# Patient Record
Sex: Male | Born: 1985 | Race: Black or African American | Hispanic: No | Smoking: Never smoker
Health system: Southern US, Community
[De-identification: ages and names within clinical notes are randomized; demographics above are authoritative.]

## PROBLEM LIST (undated history)

## (undated) DIAGNOSIS — F419 Anxiety disorder, unspecified: Secondary | ICD-10-CM

## (undated) DIAGNOSIS — I517 Cardiomegaly: Secondary | ICD-10-CM

---

## 2020-02-29 ENCOUNTER — Other Ambulatory Visit: Payer: Self-pay

## 2020-02-29 ENCOUNTER — Encounter (HOSPITAL_COMMUNITY): Payer: Self-pay | Admitting: Emergency Medicine

## 2020-02-29 ENCOUNTER — Ambulatory Visit (HOSPITAL_COMMUNITY)
Admission: EM | Admit: 2020-02-29 | Discharge: 2020-02-29 | Disposition: A | Discharge status: Home / Self Care | Payer: BC Managed Care – PPO | Attending: Family Medicine | Admitting: Family Medicine

## 2020-02-29 DIAGNOSIS — Z20822 Contact with and (suspected) exposure to covid-19: Secondary | ICD-10-CM | POA: Diagnosis not present

## 2020-02-29 DIAGNOSIS — F419 Anxiety disorder, unspecified: Secondary | ICD-10-CM | POA: Diagnosis not present

## 2020-02-29 DIAGNOSIS — B349 Viral infection, unspecified: Secondary | ICD-10-CM

## 2020-02-29 DIAGNOSIS — R0981 Nasal congestion: Secondary | ICD-10-CM

## 2020-02-29 DIAGNOSIS — R519 Headache, unspecified: Secondary | ICD-10-CM | POA: Diagnosis not present

## 2020-02-29 DIAGNOSIS — R6883 Chills (without fever): Secondary | ICD-10-CM | POA: Insufficient documentation

## 2020-02-29 DIAGNOSIS — R52 Pain, unspecified: Secondary | ICD-10-CM

## 2020-02-29 MED ORDER — BENZONATATE 100 MG PO CAPS: 100.0000 mg | ORAL_CAPSULE | Freq: Three times a day (TID) | ORAL | 0 refills | Status: AC | PRN

## 2020-02-29 MED ORDER — PROMETHAZINE-DM 6.25-15 MG/5ML PO SYRP: 5.0000 mL | ORAL_SOLUTION | Freq: Every evening | ORAL | 0 refills | Status: AC | PRN

## 2020-02-29 MED ORDER — HYDROXYZINE HCL 25 MG PO TABS: 12.5000 mg | ORAL_TABLET | Freq: Three times a day (TID) | ORAL | 0 refills | Status: AC | PRN

## 2020-02-29 MED ORDER — CETIRIZINE HCL 10 MG PO TABS: 10.0000 mg | ORAL_TABLET | Freq: Every day | ORAL | 0 refills | Status: AC

## 2020-02-29 NOTE — ED Triage Notes (Signed)
Pt sts URI sx with congestion and body aches x 2 days

## 2020-02-29 NOTE — Discharge Instructions (Addendum)
We will manage this as a viral syndrome. For sore throat or cough try using a honey-based tea. Use 3 teaspoons of honey with juice squeezed from half lemon. Place shaved pieces of ginger into 1/2-1 cup of water and warm over stove top. Then mix the ingredients and repeat every 4 hours as needed. Please take Tylenol 500mg  every 6 hours for fevers, aches, body aches. Hydrate very well with at least 2 liters of water. Eat light meals such as soups to replenish electrolytes and soft fruits, veggies. Start an antihistamine like Zyrtec (cetirizine) at 10mg  daily for postnasal drainage, sinus congestion.

## 2020-02-29 NOTE — ED Provider Notes (Signed)
[facility]   MRN: [id number] DOB: [birth year]  Subjective:   Seth Pearson is a 34 y.o. male presenting for 2 day hx of acute onset worsening sinus congestion, sinus headaches, chills, body aches, upper belly pain.  Patient has tried over-the-counter medications for sinuses, cold/flu.  He is also had a history of anxiety in the past 2 to 3 years.  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  Unfortunately now since his anxiety has worsened has not been able to drive out of state in even has a difficult time driving as far as an hour away.  He does not get regular care.  Would like to get help establishing with a new PCP.  Denies smoking cigarettes and is not currently drinking.  Denies history of allergic rhinitis hearing [place] but did use to have it when he was in [place].  No current facility-administered medications for this encounter. No current outpatient medications on file.   No Known Allergies  History reviewed. No pertinent past medical history.   History reviewed. No pertinent surgical history.  History reviewed. No pertinent family history.  Social History   Tobacco Use   Smoking status: Never Smoker   Smokeless tobacco: Never Used  Substance Use Topics   Alcohol use: Not Currently   Drug use: Never    ROS   Objective:   Vitals: BP 129/72 (BP Location: Right Arm)    Pulse (!) 104    Temp 99.2 F (37.3 C) (Oral)    Resp 18    SpO2 95%   Physical Exam Constitutional:      General: He is not in acute distress.    Appearance: Normal appearance. He is well-developed and normal weight. He is not ill-appearing, toxic-appearing or diaphoretic.  HENT:     Head: Normocephalic and atraumatic.     Right Ear: Tympanic membrane, ear canal and external ear normal. There is no impacted cerumen.     Left Ear: Tympanic membrane, ear canal and external ear normal. There is no impacted cerumen.     Nose: Nose  normal. No congestion or rhinorrhea.     Mouth/Throat:     Mouth: Mucous membranes are moist.     Pharynx: Oropharynx is clear. No oropharyngeal exudate or posterior oropharyngeal erythema.  Eyes:     General: No scleral icterus.       Right eye: No discharge.        Left eye: No discharge.     Extraocular Movements: Extraocular movements intact.     Conjunctiva/sclera: Conjunctivae normal.     Pupils: Pupils are equal, round, and reactive to light.  Cardiovascular:     Rate and Rhythm: Normal rate and regular rhythm.     Heart sounds: Normal heart sounds. No murmur. No friction rub. No gallop.   Pulmonary:     Effort: Pulmonary effort is normal. No respiratory distress.     Breath sounds: Normal breath sounds. No stridor. No wheezing, rhonchi or rales.  Musculoskeletal:     Cervical back: Normal range of motion and neck supple. No rigidity. No muscular tenderness.  Neurological:     General: No focal deficit present.     Mental Status: He is alert and oriented to person, place, and time.  Psychiatric:        Mood and Affect: Mood is anxious.        Behavior: Behavior normal.  Thought Content: Thought content normal.      Assessment and Plan :   1. Viral syndrome   2. Sinus congestion   3. Body aches   4. Generalized headaches   5. Anxiety     Will manage for viral illness such as viral URI, viral rhinitis, possible COVID-19. Counseled patient on nature of COVID-19 including modes of transmission, diagnostic testing, management and supportive care.  Offered symptomatic relief. COVID 19 testing is pending.  Recommended patient use hydroxyzine for anxiety.  Counseled on need to find a new PCP, provided him with information to 3 different resources.  Counseled patient on potential for adverse effects with medications prescribed/recommended today, ER and return-to-clinic precautions discussed, patient verbalized understanding.     Jaynee Eagles, [place] 02/29/20 1858

## 2020-03-04 LAB — NOVEL CORONAVIRUS, NAA (HOSP ORDER, SEND-OUT TO REF LAB; TAT 18-24 HRS): SARS-CoV-2, NAA: NOT DETECTED

## 2021-01-08 ENCOUNTER — Ambulatory Visit (HOSPITAL_COMMUNITY)
Admission: EM | Admit: 2021-01-08 | Discharge: 2021-01-08 | Disposition: A | Discharge status: Home / Self Care | Payer: Self-pay | Attending: Emergency Medicine | Admitting: Emergency Medicine

## 2021-01-08 ENCOUNTER — Ambulatory Visit (INDEPENDENT_AMBULATORY_CARE_PROVIDER_SITE_OTHER): Payer: Self-pay

## 2021-01-08 ENCOUNTER — Encounter (HOSPITAL_COMMUNITY): Payer: Self-pay

## 2021-01-08 ENCOUNTER — Other Ambulatory Visit: Payer: Self-pay

## 2021-01-08 DIAGNOSIS — R059 Cough, unspecified: Secondary | ICD-10-CM

## 2021-01-08 DIAGNOSIS — U099 Post covid-19 condition, unspecified: Secondary | ICD-10-CM

## 2021-01-08 DIAGNOSIS — Z8616 Personal history of COVID-19: Secondary | ICD-10-CM

## 2021-01-08 MED ORDER — ALBUTEROL SULFATE HFA 108 (90 BASE) MCG/ACT IN AERS: 1.0000 | INHALATION_SPRAY | Freq: Four times a day (QID) | RESPIRATORY_TRACT | 0 refills | Status: AC | PRN

## 2021-01-08 MED ORDER — PREDNISONE 20 MG PO TABS: 40.0000 mg | ORAL_TABLET | Freq: Every day | ORAL | 0 refills | Status: AC

## 2021-01-08 NOTE — ED Triage Notes (Signed)
Pt presents with some sinus congestion that makes him feel like he is wheezing when he lays down or exerts energy for past few weeks.

## 2021-01-08 NOTE — ED Provider Notes (Signed)
[facility]    CSN: [id number] Arrival date & time: 01/08/21  1829      History   Chief Complaint Chief Complaint  Patient presents with  . Sinus Congestion    HPI Seth Pearson is a 35 y.o. male.   Mart Colpitts presents with complaints of worsening of cough. He was diagnosed with covid-19 December 17. He generally feels improved, however his cough has persisted and he feels like over the past few days is worsening. He notes wheezing and chest congestion at times. Sometimes shortness of breath . No current chest pain  Or shortness of breath . No further fevers. He is otherwise healthy, no past medical history. Occasionally cough is productive.   ROS per HPI, negative if not otherwise mentioned.      History reviewed. No pertinent past medical history.  There are no problems to display for this patient.   History reviewed. No pertinent surgical history.     Home Medications    Prior to Admission medications   Medication Sig Start Date End Date Taking? Authorizing Provider  albuterol (PROAIR HFA) 108 (90 Base) MCG/ACT inhaler Inhale 1-2 puffs into the lungs every 6 (six) hours as needed for wheezing or shortness of breath. 01/08/21  Yes Dava Rensch, Dorene Grebe B, NP  predniSONE (DELTASONE) 20 MG tablet Take 2 tablets (40 mg total) by mouth daily with breakfast for 5 days. 01/08/21 01/13/21 Yes Millicent Blazejewski, Barron Alvine, NP  benzonatate (TESSALON) 100 MG capsule Take 1-2 capsules (100-200 mg total) by mouth 3 (three) times daily as needed. 02/29/20   Wallis Bamberg, PA-C  cetirizine (ZYRTEC ALLERGY) 10 MG tablet Take 1 tablet (10 mg total) by mouth daily. 02/29/20   Wallis Bamberg, PA-C  hydrOXYzine (ATARAX/VISTARIL) 25 MG tablet Take 0.5-1 tablets (12.5-25 mg total) by mouth every 8 (eight) hours as needed for anxiety. 02/29/20   Wallis Bamberg, PA-C  promethazine-dextromethorphan (PROMETHAZINE-DM) 6.25-15 MG/5ML syrup Take 5 mLs by mouth at bedtime as needed for cough. 02/29/20   Wallis Bamberg, PA-C    Family History Family History  Family history unknown: Yes    Social History Social History   Tobacco Use  . Smoking status: Never Smoker  . Smokeless tobacco: Never Used  Substance Use Topics  . Alcohol use: Not Currently  . Drug use: Never     Allergies   Watermelon [citrullus vulgaris]   Review of Systems Review of Systems   Physical Exam Triage Vital Signs ED Triage Vitals  Enc Vitals Group     BP 01/08/21 1903 127/68     Pulse Rate 01/08/21 1903 91     Resp 01/08/21 1903 17     Temp 01/08/21 1903 97.9 F (36.6 C)     Temp Source 01/08/21 1903 Oral     SpO2 01/08/21 1903 100 %     Weight --      Height --      Head Circumference --      Peak Flow --      Pain Score 01/08/21 1901 0     Pain Loc --      Pain Edu? --      Excl. in GC? --    No data found.  Updated Vital Signs BP 127/68 (BP Location: Right Arm)   Pulse 91   Temp 97.9 F (36.6 C) (Oral)   Resp 17   SpO2 100%    Physical Exam Constitutional:      Appearance: He is well-developed.  HENT:     Head: Normocephalic and atraumatic.  Cardiovascular:     Rate and Rhythm: Normal rate.  Pulmonary:     Effort: Pulmonary effort is normal. No respiratory distress.     Breath sounds: Normal breath sounds.  Skin:    General: Skin is warm and dry.  Neurological:     Mental Status: He is alert and oriented to person, place, and time.      UC Treatments / Results  Labs (all labs ordered are listed, but only abnormal results are displayed) Labs Reviewed - No data to display  EKG   Radiology DG Chest 2 View  Result Date: 01/08/2021 CLINICAL DATA:  Worsening cough, history of COVID prior month EXAM: CHEST - 2 VIEW COMPARISON:  None. FINDINGS: Slight interstitial prominence. No pleural effusion. The heart size and mediastinal contours are within normal limits. The visualized skeletal structures are unremarkable. IMPRESSION: Slight interstitial prominence. There may be a  component of scarring from reported COVID. Electronically Signed   By: Guadlupe Spanish M.D.   On: 01/08/2021 19:53    Procedures Procedures (including critical care time)  Medications Ordered in UC Medications - No data to display  Initial Impression / Assessment and Plan / UC Course  I have reviewed the triage vital signs and the nursing notes.  Pertinent labs & imaging results that were available during my care of the patient were reviewed by me and considered in my medical decision making (see chart for details).     Recent covid. Persistent cough. Chest xray with questionable scarring, otherwise no acute findings. Inhaler and prednisone provided today to see if this is helpful with symptoms. Otherwise follow up recommendations and post-covid care clinic information provided. Return precautions provided. Patient verbalized understanding and agreeable to plan.   Final Clinical Impressions(s) / UC Diagnoses   Final diagnoses:  Cough  Post-COVID-19 condition     Discharge Instructions     Your chest xray looks overall well today, radiology questions some potential scarring?  I think we can try to see if an inhaler is helpful when you have episodes of wheezing.  I think we can try a course of prednisone to see if this is helpful at all.  Otherwise you may need to follow up with pulmonology, you can start with our post-covid care clinic for further recommendations if symptoms persist.     ED Prescriptions    Medication Sig Dispense Auth. Provider   albuterol (PROAIR HFA) 108 (90 Base) MCG/ACT inhaler Inhale 1-2 puffs into the lungs every 6 (six) hours as needed for wheezing or shortness of breath. 1 each Georgetta Haber, NP   predniSONE (DELTASONE) 20 MG tablet Take 2 tablets (40 mg total) by mouth daily with breakfast for 5 days. 10 tablet Georgetta Haber, NP     PDMP not reviewed this encounter.   Georgetta Haber, NP 01/10/21 1117

## 2021-01-08 NOTE — Discharge Instructions (Addendum)
Your chest xray looks overall well today, radiology questions some potential scarring?  I think we can try to see if an inhaler is helpful when you have episodes of wheezing.  I think we can try a course of prednisone to see if this is helpful at all.  Otherwise you may need to follow up with pulmonology, you can start with our post-covid care clinic for further recommendations if symptoms persist.

## 2021-04-14 DIAGNOSIS — J019 Acute sinusitis, unspecified: Secondary | ICD-10-CM | POA: Diagnosis not present

## 2021-04-14 DIAGNOSIS — Z20822 Contact with and (suspected) exposure to covid-19: Secondary | ICD-10-CM | POA: Diagnosis not present

## 2022-01-08 IMAGING — DX DG CHEST 2V
2 series · 2 of 2 positions shown · non-contrast
Comparison: None.

CLINICAL DATA: Worsening cough, history of COVID prior month

EXAM:
CHEST - 2 VIEW

[chest pa]
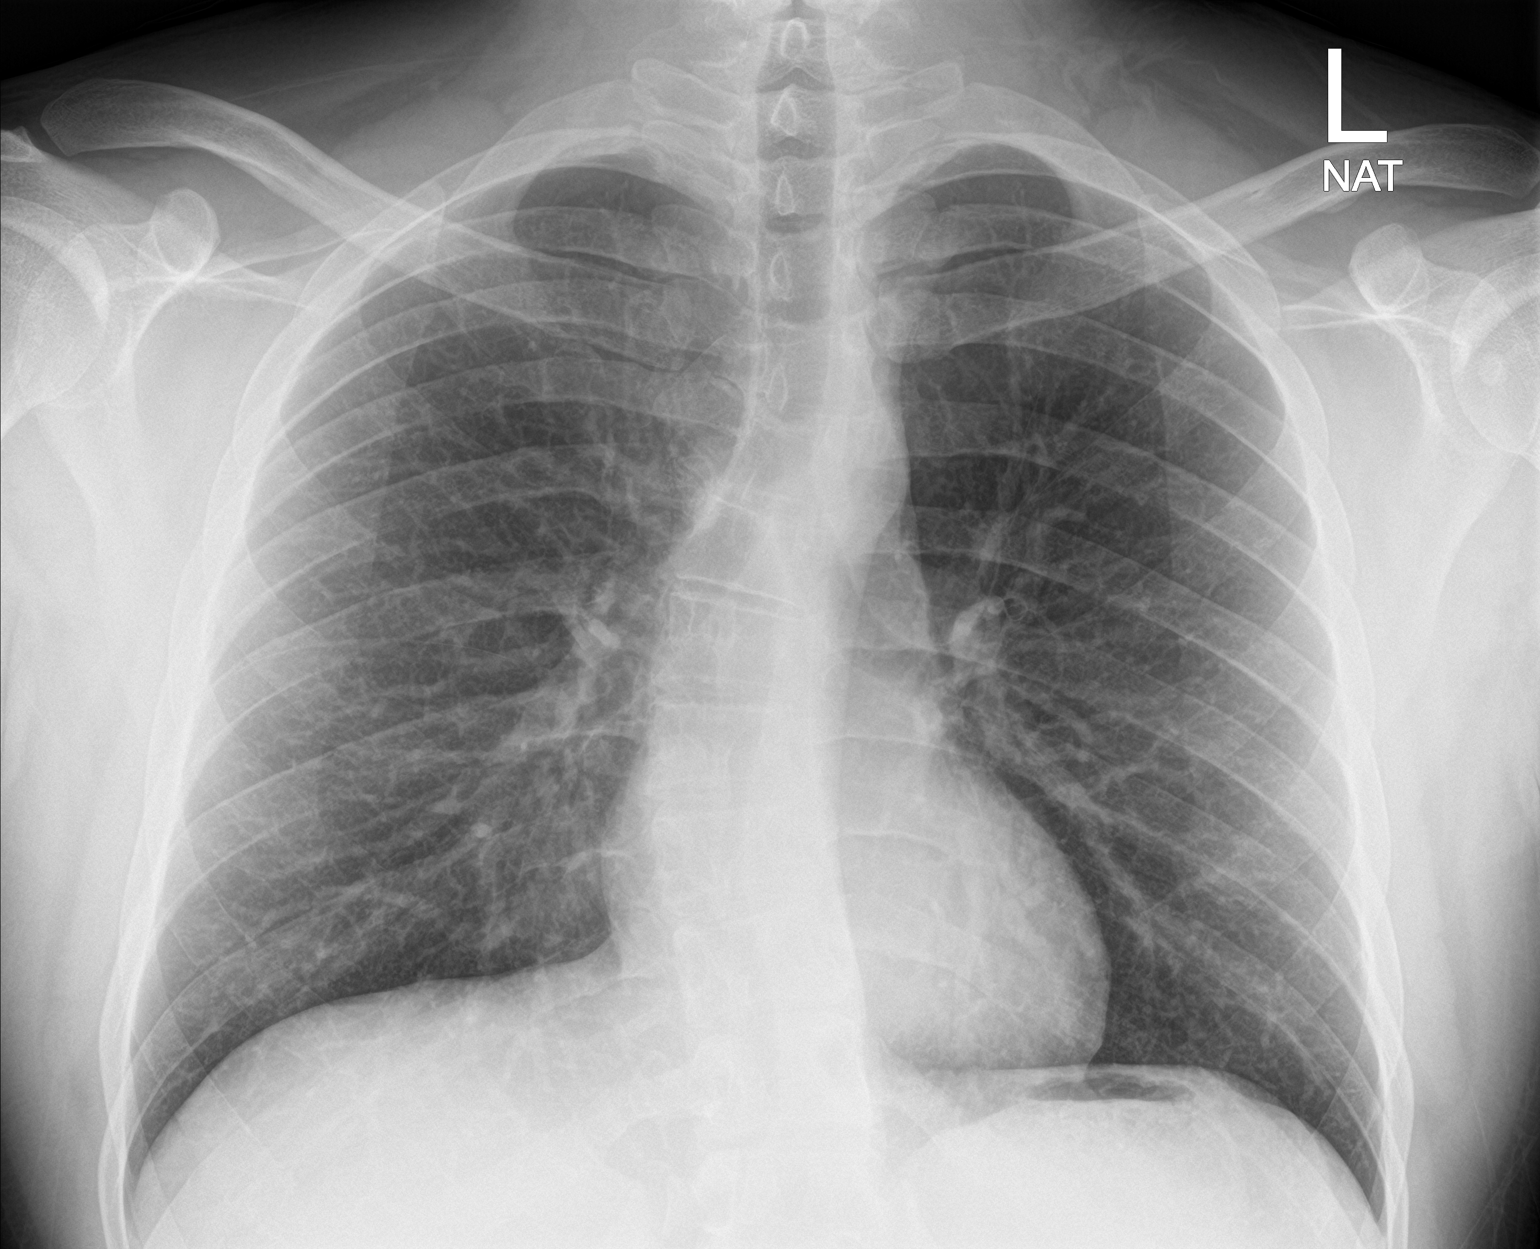

[chest lat]
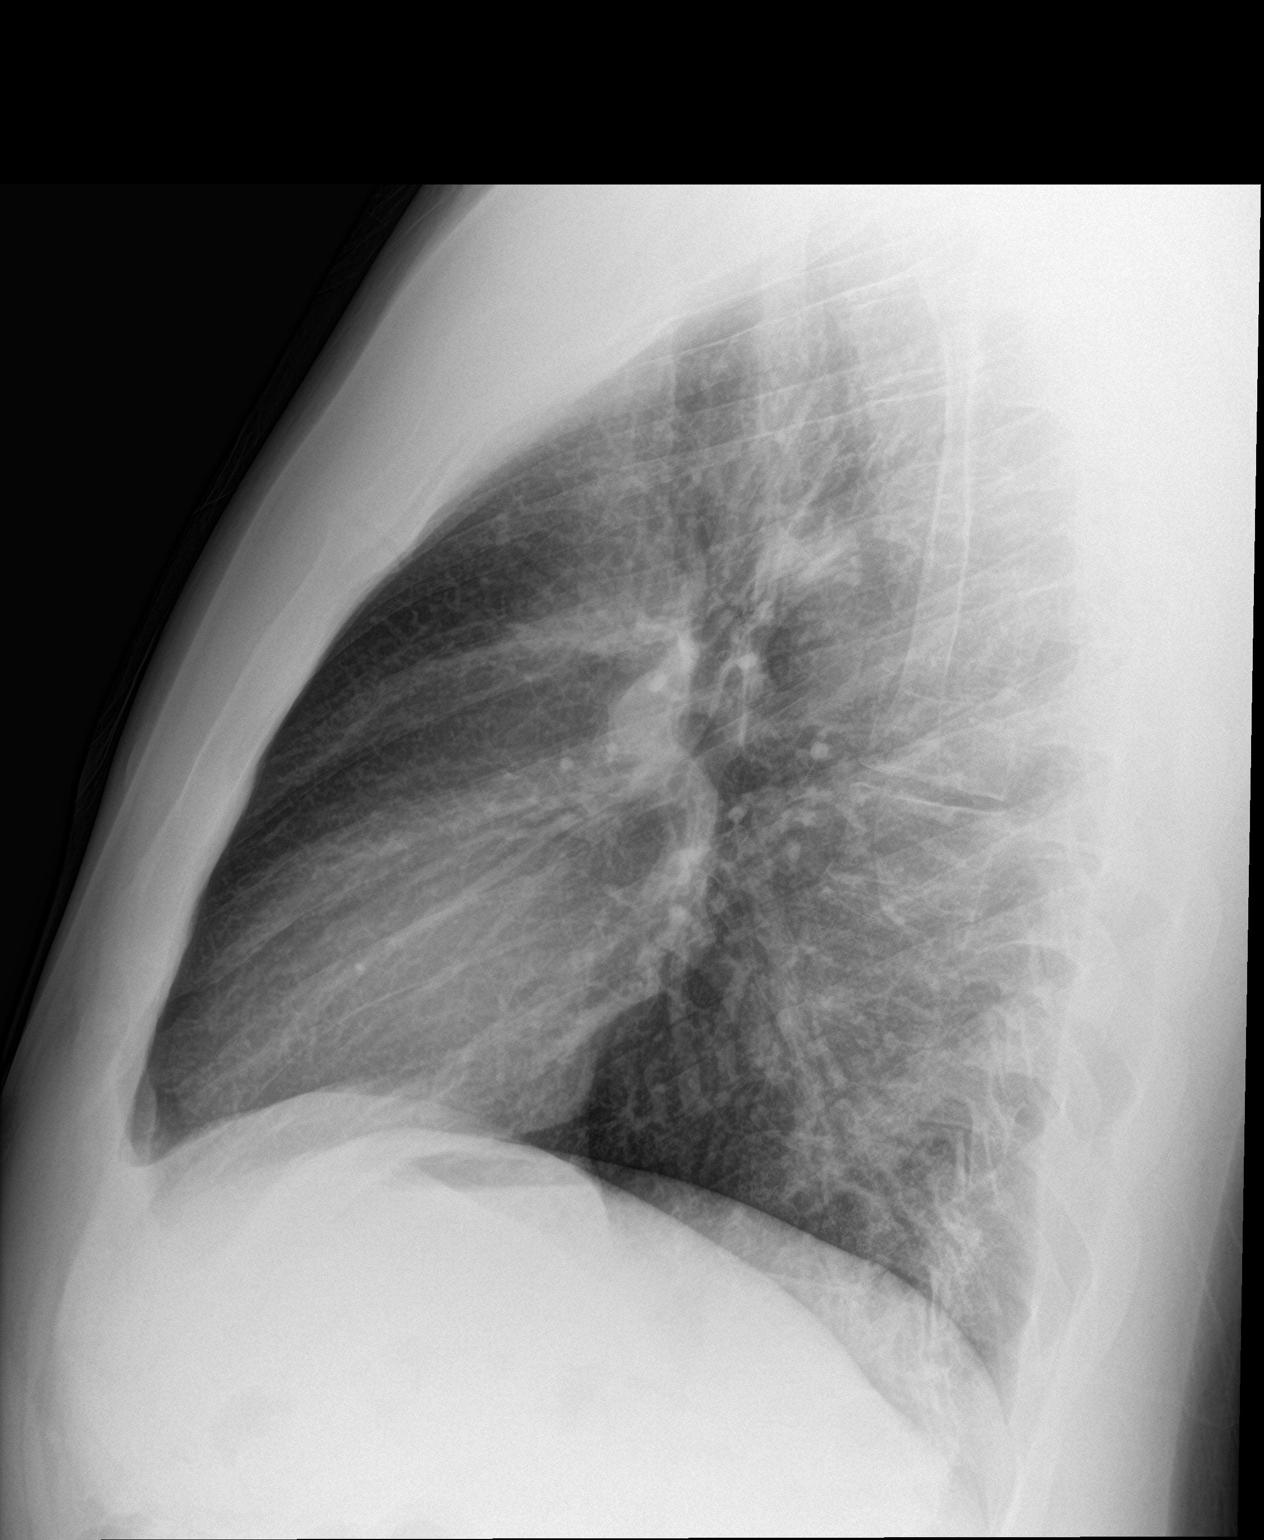

[2 of 2 positions shown; findings below may reference images not displayed]

FINDINGS: Slight interstitial prominence. No pleural effusion. The heart size
and mediastinal contours are within normal limits. The visualized
skeletal structures are unremarkable.
IMPRESSION: Slight interstitial prominence. There may be a component of scarring
from reported COVID.

## 2022-01-16 DIAGNOSIS — Z0000 Encounter for general adult medical examination without abnormal findings: Secondary | ICD-10-CM | POA: Diagnosis not present

## 2022-01-16 DIAGNOSIS — R5383 Other fatigue: Secondary | ICD-10-CM | POA: Diagnosis not present

## 2022-01-16 DIAGNOSIS — F419 Anxiety disorder, unspecified: Secondary | ICD-10-CM | POA: Diagnosis not present

## 2022-01-16 DIAGNOSIS — Z13220 Encounter for screening for lipoid disorders: Secondary | ICD-10-CM | POA: Diagnosis not present

## 2022-01-16 DIAGNOSIS — Z113 Encounter for screening for infections with a predominantly sexual mode of transmission: Secondary | ICD-10-CM | POA: Diagnosis not present

## 2022-12-01 DIAGNOSIS — F411 Generalized anxiety disorder: Secondary | ICD-10-CM | POA: Diagnosis not present

## 2022-12-01 DIAGNOSIS — F410 Panic disorder [episodic paroxysmal anxiety] without agoraphobia: Secondary | ICD-10-CM | POA: Diagnosis not present

## 2022-12-02 DIAGNOSIS — F331 Major depressive disorder, recurrent, moderate: Secondary | ICD-10-CM | POA: Diagnosis not present

## 2022-12-02 DIAGNOSIS — F410 Panic disorder [episodic paroxysmal anxiety] without agoraphobia: Secondary | ICD-10-CM | POA: Diagnosis not present

## 2022-12-02 DIAGNOSIS — Z1331 Encounter for screening for depression: Secondary | ICD-10-CM | POA: Diagnosis not present

## 2022-12-02 DIAGNOSIS — F411 Generalized anxiety disorder: Secondary | ICD-10-CM | POA: Diagnosis not present

## 2022-12-30 DIAGNOSIS — F331 Major depressive disorder, recurrent, moderate: Secondary | ICD-10-CM | POA: Diagnosis not present

## 2022-12-30 DIAGNOSIS — F411 Generalized anxiety disorder: Secondary | ICD-10-CM | POA: Diagnosis not present

## 2022-12-30 DIAGNOSIS — F410 Panic disorder [episodic paroxysmal anxiety] without agoraphobia: Secondary | ICD-10-CM | POA: Diagnosis not present

## 2023-01-27 DIAGNOSIS — F331 Major depressive disorder, recurrent, moderate: Secondary | ICD-10-CM | POA: Diagnosis not present

## 2023-01-27 DIAGNOSIS — F410 Panic disorder [episodic paroxysmal anxiety] without agoraphobia: Secondary | ICD-10-CM | POA: Diagnosis not present

## 2023-01-27 DIAGNOSIS — F411 Generalized anxiety disorder: Secondary | ICD-10-CM | POA: Diagnosis not present

## 2023-04-12 DIAGNOSIS — F411 Generalized anxiety disorder: Secondary | ICD-10-CM | POA: Diagnosis not present

## 2023-04-12 DIAGNOSIS — F410 Panic disorder [episodic paroxysmal anxiety] without agoraphobia: Secondary | ICD-10-CM | POA: Diagnosis not present

## 2023-04-12 DIAGNOSIS — Z1339 Encounter for screening examination for other mental health and behavioral disorders: Secondary | ICD-10-CM | POA: Diagnosis not present

## 2023-04-12 DIAGNOSIS — F331 Major depressive disorder, recurrent, moderate: Secondary | ICD-10-CM | POA: Diagnosis not present

## 2023-05-27 DIAGNOSIS — F410 Panic disorder [episodic paroxysmal anxiety] without agoraphobia: Secondary | ICD-10-CM | POA: Diagnosis not present

## 2023-05-27 DIAGNOSIS — F411 Generalized anxiety disorder: Secondary | ICD-10-CM | POA: Diagnosis not present

## 2023-05-27 DIAGNOSIS — F331 Major depressive disorder, recurrent, moderate: Secondary | ICD-10-CM | POA: Diagnosis not present

## 2023-06-10 DIAGNOSIS — F331 Major depressive disorder, recurrent, moderate: Secondary | ICD-10-CM | POA: Diagnosis not present

## 2023-06-10 DIAGNOSIS — F411 Generalized anxiety disorder: Secondary | ICD-10-CM | POA: Diagnosis not present

## 2023-06-10 DIAGNOSIS — F410 Panic disorder [episodic paroxysmal anxiety] without agoraphobia: Secondary | ICD-10-CM | POA: Diagnosis not present

## 2024-04-18 ENCOUNTER — Emergency Department (HOSPITAL_COMMUNITY): Payer: Self-pay

## 2024-04-18 ENCOUNTER — Other Ambulatory Visit: Payer: Self-pay

## 2024-04-18 ENCOUNTER — Encounter (HOSPITAL_COMMUNITY): Payer: Self-pay | Admitting: Emergency Medicine

## 2024-04-18 ENCOUNTER — Emergency Department (HOSPITAL_COMMUNITY)
Admission: EM | Admit: 2024-04-18 | Discharge: 2024-04-18 | Disposition: A | Discharge status: Home / Self Care | Payer: Self-pay | Attending: Emergency Medicine | Admitting: Emergency Medicine

## 2024-04-18 DIAGNOSIS — I480 Paroxysmal atrial fibrillation: Secondary | ICD-10-CM | POA: Insufficient documentation

## 2024-04-18 HISTORY — DX: Anxiety disorder, unspecified: F41.9

## 2024-04-18 HISTORY — DX: Cardiomegaly: I51.7

## 2024-04-18 LAB — BASIC METABOLIC PANEL WITH GFR
Anion gap: 14 (ref 5–15)
BUN: 8 mg/dL (ref 6–20)
CO2: 20 mmol/L — ABNORMAL LOW (ref 22–32)
Calcium: 9.3 mg/dL (ref 8.9–10.3)
Chloride: 102 mmol/L (ref 98–111)
Creatinine, Ser: 1 mg/dL (ref 0.61–1.24)
GFR, Estimated: >60 mL/min (ref >60)
Glucose, Bld: 86 mg/dL (ref 70–99)
Potassium: 4 mmol/L (ref 3.5–5.1)
Sodium: 136 mmol/L (ref 135–145)

## 2024-04-18 LAB — CBC
HCT: 52.1 % — ABNORMAL HIGH (ref 39.0–52.0)
Hemoglobin: 17.8 g/dL — ABNORMAL HIGH (ref 13.0–17.0)
MCH: 31.4 pg (ref 26.0–34.0)
MCHC: 34.2 g/dL (ref 30.0–36.0)
MCV: 91.9 fL (ref 80.0–100.0)
Platelets: 252 10*3/uL (ref 150–400)
RBC: 5.67 MIL/uL (ref 4.22–5.81)
RDW: 12.7 % (ref 11.5–15.5)
WBC: 7.4 10*3/uL (ref 4.0–10.5)
nRBC: 0 % (ref 0.0–0.2)

## 2024-04-18 LAB — TROPONIN I (HIGH SENSITIVITY)
Troponin I (High Sensitivity): 5 ng/L (ref <18)
Troponin I (High Sensitivity): 6 ng/L (ref <18)

## 2024-04-18 LAB — MAGNESIUM: Magnesium: 1.9 mg/dL (ref 1.7–2.4)

## 2024-04-18 LAB — TSH: TSH: 0.652 u[IU]/mL (ref 0.350–4.500)

## 2024-04-18 MED ORDER — METOPROLOL TARTRATE 25 MG PO TABS: 12.5000 mg | ORAL_TABLET | Freq: Once | ORAL | 0 refills | Status: AC | PRN

## 2024-04-18 NOTE — ED Provider Triage Note (Addendum)
 Emergency Medicine Provider Triage Evaluation Note  Seth Pearson , a 38 y.o. male  was evaluated in triage.  Pt complains of chest pain in the center of his chest and shortness of breath that started last night.  Reports his watch alerted him to an abnormal rhythm and a very fast heart rate into the 150s.  Reports this is never happened before.  Denies any syncope.  Review of Systems  Positive: As above Negative: As above  Physical Exam  BP (!) 156/77 (BP Location: Right Arm)   Pulse 66   Temp 97.7 F (36.5 C)   Resp 18   SpO2 100%  Gen:   Awake, no distress   Resp:  Normal effort  MSK:   Moves extremities without difficulty    Medical Decision Making  Medically screening exam initiated at 4:07 PM.  Appropriate orders placed.  Vir Whetstine was informed that the remainder of the evaluation will be completed by another provider, this initial triage assessment does not replace that evaluation, and the importance of remaining in the ED until their evaluation is complete.  A-fib with RVR on EKG, will work on getting patient back as soon as possible.   Rexie Catena, PA-C 04/18/24 1607    Rexie Catena, PA-C 04/18/24 [phone]

## 2024-04-18 NOTE — ED Provider Notes (Signed)
 [place] [facility] AT [facility] Provider Note   CSN: [id number] Arrival date & time: 04/18/24  1540     History  Chief Complaint  Patient presents with   Chest Pain    Seth Pearson is a 38 y.o. male.  Patient is a 38 year old healthy male with no known medical conditions who is presenting today due to palpitations, chest pain and shortness of breath.  He reports that symptoms started around 11:00 last night and were persistent.  He reports at one point in the night he was even woke up by the symptoms.  This morning he put on his Apple Watch and it alerted him that he was in atrial fibrillation and he should seek medical care.  He is felt a little bit lightheaded today but denies any syncope.  He does smoke tobacco with hookah socially but denies any marijuana or drug use.  He denies excess alcohol use.  He takes no over-the-counter or prescription medications.  He denies excessive caffeine use.  He does report when he was a teenager he had to wear a monitor because he was having palpitations at the time but it was normal and he has really not had any problems until last night.  He has not had any unilateral leg pain or swelling.  He denies any recent illnesses.  He did have a long trip in a bus at the beginning of April where he went from [place] down to [place]  and back but had multiple stops along the way.  No family history of PE or DVT.  He reports now the pain is pretty much gone.  The history is provided by the patient.  Chest Pain      Home Medications Prior to Admission medications   Medication Sig Start Date End Date Taking? Authorizing Provider  metoprolol  tartrate (LOPRESSOR ) 25 MG tablet Take 0.5 tablets (12.5 mg total) by mouth once as needed for up to 1 dose (for palpitations and persistent heart rate of higher than 120). 04/18/24  Yes Genevive Printup, Laurina Popper, MD  albuterol  (PROAIR  HFA) 108 (90 Base) MCG/ACT inhaler Inhale 1-2 puffs into the lungs  every 6 (six) hours as needed for wheezing or shortness of breath. 01/08/21   Burky, Natalie B, NP  benzonatate  (TESSALON ) 100 MG capsule Take 1-2 capsules (100-200 mg total) by mouth 3 (three) times daily as needed. 02/29/20   Adolph Hoop, PA-C  cetirizine  (ZYRTEC  ALLERGY) 10 MG tablet Take 1 tablet (10 mg total) by mouth daily. 02/29/20   Adolph Hoop, PA-C  hydrOXYzine  (ATARAX /VISTARIL ) 25 MG tablet Take 0.5-1 tablets (12.5-25 mg total) by mouth every 8 (eight) hours as needed for anxiety. 02/29/20   Adolph Hoop, PA-C  promethazine -dextromethorphan (PROMETHAZINE -DM) 6.25-15 MG/5ML syrup Take 5 mLs by mouth at bedtime as needed for cough. 02/29/20   Adolph Hoop, PA-C      Allergies    Watermelon [citrullus vulgaris]    Review of Systems   Review of Systems  Cardiovascular:  Positive for chest pain.    Physical Exam Updated Vital Signs BP (!) 156/77 (BP Location: Right Arm)   Pulse 66   Temp 97.7 F (36.5 C)   Resp 18   SpO2 100%  Physical Exam Vitals and nursing note reviewed.  Constitutional:      General: He is not in acute distress.    Appearance: He is well-developed.  HENT:     Head: Normocephalic and atraumatic.     Mouth/Throat:     Mouth:  Mucous membranes are moist.  Eyes:     Conjunctiva/sclera: Conjunctivae normal.     Pupils: Pupils are equal, round, and reactive to light.  Cardiovascular:     Rate and Rhythm: Normal rate and regular rhythm.     Pulses: Normal pulses.     Heart sounds: No murmur heard. Pulmonary:     Effort: Pulmonary effort is normal. No respiratory distress.     Breath sounds: Normal breath sounds. No wheezing or rales.  Abdominal:     General: There is no distension.     Palpations: Abdomen is soft.     Tenderness: There is no abdominal tenderness. There is no guarding or rebound.  Musculoskeletal:        General: No tenderness. Normal range of motion.     Cervical back: Normal range of motion and neck supple.     Right lower leg: No edema.      Left lower leg: No edema.  Skin:    General: Skin is warm and dry.     Findings: No erythema or rash.  Neurological:     Mental Status: He is alert and oriented to person, place, and time. Mental status is at baseline.  Psychiatric:        Behavior: Behavior normal.     ED Results / Procedures / Treatments   Labs (all labs ordered are listed, but only abnormal results are displayed) Labs Reviewed  BASIC METABOLIC PANEL WITH GFR - Abnormal; Notable for the following components:      Result Value   CO2 20 (*)    All other components within normal limits  CBC - Abnormal; Notable for the following components:   Hemoglobin 17.8 (*)    HCT 52.1 (*)    All other components within normal limits  TSH  MAGNESIUM  TROPONIN I (HIGH SENSITIVITY)  TROPONIN I (HIGH SENSITIVITY)    EKG EKG Interpretation Date/Time:  Tuesday April 18 2024 15:55:44 EDT Ventricular Rate:  128 PR Interval:    QRS Duration:  66 QT Interval:  310 QTC Calculation: 452 R Axis:   -35  Text Interpretation: Atrial fibrillation with rapid ventricular response Left axis deviation No previous ECGs available Confirmed by Almond Army ([id number]) on 04/18/2024 5:38:35 PM  Radiology DG Chest 2 View Result Date: 04/18/2024 CLINICAL DATA:  Chest pain. EXAM: CHEST - 2 VIEW COMPARISON:  Chest radiograph dated 01/08/2021. FINDINGS: The heart size and mediastinal contours are within normal limits. Both lungs are clear. The visualized skeletal structures are unremarkable. IMPRESSION: No active cardiopulmonary disease. Electronically Signed   By: Angus Bark M.D.   On: 04/18/2024 16:50    Procedures Procedures    Medications Ordered in ED Medications - No data to display  ED Course/ Medical Decision Making/ A&P                                 Medical Decision Making Amount and/or Complexity of Data Reviewed Labs: ordered. Decision-making details documented in ED Course. Radiology: ordered and independent  interpretation performed. Decision-making details documented in ED Course. ECG/medicine tests: ordered and independent interpretation performed. Decision-making details documented in ED Course.   Pt presenting today with a complaint that caries a high risk for morbidity and mortality. Here today with the above symptoms and initial EKG showing atrial fibrillation with RVR with heart rate around 150.  However after getting to the room and getting in the bed  patient now appears to be back in sinus rhythm based on my interpretation of his constant cardiac monitoring.  Patient did recently have a long trip but has not had any leg pain or swelling and lower suspicion for PE.  Will ensure no electrolyte abnormalities or kidney issues.  Will ensure troponin is normal as he was having chest pain and some shortness of breath while he was in atrial fibrillation but suspect that was just from the rhythm.  Patient is a CHA2DS2-VASc of 0 and at this time do not feel that he needs anticoagulation.  He is currently in a sinus rhythm.  I independently interpreted patient's labs and EKG.  EKG did show atrial fibrillation with RVR.  BMP within normal limits, CBC with normal white count and mildly increased hemoglobin of 17, troponin was normal at 5 and that was after 12 hours of symptoms.  Magnesium 1.9, TSH wnl. I have independently visualized and interpreted pt's images today.  Chest x-ray within normal limits.  Patient was able to stand and walk around the department with no chest pain or shortness of breath.  Feels his normal self.  At this time feel that patient is stable for discharge home.  Will give a prescription for metoprolol  as needed if he goes back into atrial fibrillation.  He will need follow-up with the atrial fibrillation clinic.  He is comfortable with this plan.       Final Clinical Impression(s) / ED Diagnoses Final diagnoses:  Paroxysmal atrial fibrillation (HCC)    Rx / DC Orders ED Discharge  Orders          Ordered    metoprolol  tartrate (LOPRESSOR ) 25 MG tablet  Once PRN        04/18/24 1946    Amb Referral to AFIB Clinic        04/18/24 1946              Almond Army, MD 04/18/24 1947

## 2024-04-18 NOTE — Discharge Instructions (Signed)
 All the blood work, x-ray are normal.  You went back into a regular rhythm however if this were to happen again and your heart rate is elevated for more than 30 minutes you can take half of a tablet of metoprolol  but if heart rate remains elevated for longer than 30 to 45 minutes after taking that medication return to the emergency room.

## 2024-04-18 NOTE — ED Triage Notes (Signed)
 Pt complains of chest pain and heart palpitations since yesterday. Denies n/v. Some SOB.
